# Patient Record
Sex: Male | Born: 1975 | Race: Black or African American | Smoking: Never smoker
Health system: Southern US, Community
[De-identification: ages and names within clinical notes are randomized; demographics above are authoritative.]

---

## 2012-03-14 ENCOUNTER — Other Ambulatory Visit: Payer: Self-pay | Admitting: Occupational Medicine

## 2012-03-14 ENCOUNTER — Ambulatory Visit: Payer: Self-pay

## 2012-03-14 DIAGNOSIS — R52 Pain, unspecified: Secondary | ICD-10-CM

## 2015-08-27 ENCOUNTER — Emergency Department (HOSPITAL_COMMUNITY): Payer: Medicaid Other

## 2015-08-27 ENCOUNTER — Emergency Department (HOSPITAL_COMMUNITY)
Admission: EM | Admit: 2015-08-27 | Discharge: 2015-08-27 | Disposition: A | Discharge status: Home / Self Care | Payer: Medicaid Other | Attending: Emergency Medicine | Admitting: Emergency Medicine

## 2015-08-27 ENCOUNTER — Encounter (HOSPITAL_COMMUNITY): Payer: Self-pay

## 2015-08-27 DIAGNOSIS — R079 Chest pain, unspecified: Secondary | ICD-10-CM

## 2015-08-27 DIAGNOSIS — R000 Tachycardia, unspecified: Secondary | ICD-10-CM | POA: Diagnosis not present

## 2015-08-27 DIAGNOSIS — R911 Solitary pulmonary nodule: Secondary | ICD-10-CM

## 2015-08-27 LAB — I-STAT CHEM 8, ED
BUN: 11 mg/dL (ref 6–20)
CALCIUM ION: 1.1 mmol/L — AB (ref 1.12–1.23)
CHLORIDE: 102 mmol/L (ref 101–111)
Creatinine, Ser: 0.7 mg/dL (ref 0.61–1.24)
Glucose, Bld: 145 mg/dL — ABNORMAL HIGH (ref 65–99)
HEMATOCRIT: 47 % (ref 39.0–52.0)
Hemoglobin: 16 g/dL (ref 13.0–17.0)
POTASSIUM: 3.5 mmol/L (ref 3.5–5.1)
SODIUM: 140 mmol/L (ref 135–145)
TCO2: 23 mmol/L (ref 0–100)

## 2015-08-27 LAB — COMPREHENSIVE METABOLIC PANEL
ALBUMIN: 3.8 g/dL (ref 3.5–5.0)
ALK PHOS: 64 U/L (ref 38–126)
ALT: 18 U/L (ref 17–63)
ANION GAP: 11 (ref 5–15)
AST: 29 U/L (ref 15–41)
BUN: 10 mg/dL (ref 6–20)
CALCIUM: 9.7 mg/dL (ref 8.9–10.3)
CO2: 21 mmol/L — AB (ref 22–32)
Chloride: 105 mmol/L (ref 101–111)
Creatinine, Ser: 0.9 mg/dL (ref 0.61–1.24)
GFR calc Af Amer: >60 mL/min (ref >60)
GFR calc non Af Amer: >60 mL/min (ref >60)
GLUCOSE: 147 mg/dL — AB (ref 65–99)
Potassium: 3.6 mmol/L (ref 3.5–5.1)
SODIUM: 137 mmol/L (ref 135–145)
Total Bilirubin: 0.9 mg/dL (ref 0.3–1.2)
Total Protein: 7.4 g/dL (ref 6.5–8.1)

## 2015-08-27 LAB — CBC
HCT: 42.8 % (ref 39.0–52.0)
HEMOGLOBIN: 15 g/dL (ref 13.0–17.0)
MCH: 28.1 pg (ref 26.0–34.0)
MCHC: 35 g/dL (ref 30.0–36.0)
MCV: 80.1 fL (ref 78.0–100.0)
PLATELETS: 271 10*3/uL (ref 150–400)
RBC: 5.34 MIL/uL (ref 4.22–5.81)
RDW: 12.3 % (ref 11.5–15.5)
WBC: 6 10*3/uL (ref 4.0–10.5)

## 2015-08-27 LAB — LIPASE, BLOOD: Lipase: 20 U/L (ref 11–51)

## 2015-08-27 LAB — I-STAT TROPONIN, ED
Troponin i, poc: 0 ng/mL (ref 0.00–0.08)
Troponin i, poc: 0 ng/mL (ref 0.00–0.08)

## 2015-08-27 MED ORDER — LORAZEPAM 1 MG PO TABS: 1.0000 mg | ORAL_TABLET | Freq: Every day | ORAL | Status: AC

## 2015-08-27 MED ORDER — ONDANSETRON HCL 4 MG/2ML IJ SOLN
4.0000 mg | Freq: Once | INTRAMUSCULAR | Status: AC
  Administered 2015-08-27: 4 mg via INTRAVENOUS
  Filled 2015-08-27: qty 2

## 2015-08-27 MED ORDER — FENTANYL CITRATE (PF) 100 MCG/2ML IJ SOLN
50.0000 ug | Freq: Once | INTRAMUSCULAR | Status: AC
  Administered 2015-08-27: 50 ug via INTRAVENOUS
  Filled 2015-08-27: qty 2

## 2015-08-27 MED ORDER — IOHEXOL 350 MG/ML SOLN
100.0000 mL | Freq: Once | INTRAVENOUS | Status: AC | PRN
  Administered 2015-08-27: 100 mL via INTRAVENOUS

## 2015-08-27 MED ORDER — SODIUM CHLORIDE 0.9 % IV BOLUS (SEPSIS)
1000.0000 mL | Freq: Once | INTRAVENOUS | Status: AC
  Administered 2015-08-27: 1000 mL via INTRAVENOUS

## 2015-08-27 MED ORDER — LORAZEPAM 1 MG PO TABS
0.5000 mg | ORAL_TABLET | Freq: Once | ORAL | Status: AC
Start: 2015-08-27 — End: 2015-08-27
  Administered 2015-08-27: 0.5 mg via ORAL
  Filled 2015-08-27: qty 1

## 2015-08-27 NOTE — ED Notes (Signed)
Per EMS - [facility] for chest pressure, dizziness, inability to sleep, nausea. Pt symptoms intermittent but have progressed since Friday. Pt c/o chest pressure, unable to eat/drink/sleep.   324mg  aspirin, 1 nitro, 500ml bolus

## 2015-08-27 NOTE — Discharge Instructions (Signed)
Nonspecific Chest Pain  °Chest pain can be caused by many different conditions. There is always a chance that your pain could be related to something serious, such as a heart attack or a blood clot in your lungs. Chest pain can also be caused by conditions that are not life-threatening. If you have chest pain, it is very important to follow up with your health care provider. °CAUSES  °Chest pain can be caused by: °· Heartburn. °· Pneumonia or bronchitis. °· Anxiety or stress. °· Inflammation around your heart (pericarditis) or lung (pleuritis or pleurisy). °· A blood clot in your lung. °· A collapsed lung (pneumothorax). It can develop suddenly on its own (spontaneous pneumothorax) or from trauma to the chest. °· Shingles infection (varicella-zoster virus). °· Heart attack. °· Damage to the bones, muscles, and cartilage that make up your chest wall. This can include: °¨ Bruised bones due to injury. °¨ Strained muscles or cartilage due to frequent or repeated coughing or overwork. °¨ Fracture to one or more ribs. °¨ Sore cartilage due to inflammation (costochondritis). °RISK FACTORS  °Risk factors for chest pain may include: °· Activities that increase your risk for trauma or injury to your chest. °· Respiratory infections or conditions that cause frequent coughing. °· Medical conditions or overeating that can cause heartburn. °· Heart disease or family history of heart disease. °· Conditions or health behaviors that increase your risk of developing a blood clot. °· Having had chicken pox (varicella zoster). °SIGNS AND SYMPTOMS °Chest pain can feel like: °· Burning or tingling on the surface of your chest or deep in your chest. °· Crushing, pressure, aching, or squeezing pain. °· Dull or sharp pain that is worse when you move, cough, or take a deep breath. °· Pain that is also felt in your back, neck, shoulder, or arm, or pain that spreads to any of these areas. °Your chest pain may come and go, or it may stay  constant. °DIAGNOSIS °Lab tests or other studies may be needed to find the cause of your pain. Your health care provider may have you take a test called an ambulatory ECG (electrocardiogram). An ECG records your heartbeat patterns at the time the test is performed. You may also have other tests, such as: °· Transthoracic echocardiogram (TTE). During echocardiography, sound waves are used to create a picture of all of the heart structures and to look at how blood flows through your heart. °· Transesophageal echocardiogram (TEE). This is a more advanced imaging test that obtains images from inside your body. It allows your health care provider to see your heart in finer detail. °· Cardiac monitoring. This allows your health care provider to monitor your heart rate and rhythm in real time. °· Holter monitor. This is a portable device that records your heartbeat and can help to diagnose abnormal heartbeats. It allows your health care provider to track your heart activity for several days, if needed. °· Stress tests. These can be done through exercise or by taking medicine that makes your heart beat more quickly. °· Blood tests. °· Imaging tests. °TREATMENT  °Your treatment depends on what is causing your chest pain. Treatment may include: °· Medicines. These may include: °¨ Acid blockers for heartburn. °¨ Anti-inflammatory medicine. °¨ Pain medicine for inflammatory conditions. °¨ Antibiotic medicine, if an infection is present. °¨ Medicines to dissolve blood clots. °¨ Medicines to treat coronary artery disease. °· Supportive care for conditions that do not require medicines. This may include: °¨ Resting. °¨ Applying heat   or cold packs to injured areas. °¨ Limiting activities until pain decreases. °HOME CARE INSTRUCTIONS °· If you were prescribed an antibiotic medicine, finish it all even if you start to feel better. °· Avoid any activities that bring on chest pain. °· Do not use any tobacco products, including  cigarettes, chewing tobacco, or electronic cigarettes. If you need help quitting, ask your health care provider. °· Do not drink alcohol. °· Take medicines only as directed by your health care provider. °· Keep all follow-up visits as directed by your health care provider. This is important. This includes any further testing if your chest pain does not go away. °· If heartburn is the cause for your chest pain, you may be told to keep your head raised (elevated) while sleeping. This reduces the chance that acid will go from your stomach into your esophagus. °· Make lifestyle changes as directed by your health care provider. These may include: °¨ Getting regular exercise. Ask your health care provider to suggest some activities that are safe for you. °¨ Eating a heart-healthy diet. A registered dietitian can help you to learn healthy eating options. °¨ Maintaining a healthy weight. °¨ Managing diabetes, if necessary. °¨ Reducing stress. °SEEK MEDICAL CARE IF: °· Your chest pain does not go away after treatment. °· You have a rash with blisters on your chest. °· You have a fever. °SEEK IMMEDIATE MEDICAL CARE IF:  °· Your chest pain is worse. °· You have an increasing cough, or you cough up blood. °· You have severe abdominal pain. °· You have severe weakness. °· You faint. °· You have chills. °· You have sudden, unexplained chest discomfort. °· You have sudden, unexplained discomfort in your arms, back, neck, or jaw. °· You have shortness of breath at any time. °· You suddenly start to sweat, or your skin gets clammy. °· You feel nauseous or you vomit. °· You suddenly feel light-headed or dizzy. °· Your heart begins to beat quickly, or it feels like it is skipping beats. °These symptoms may represent a serious problem that is an emergency. Do not wait to see if the symptoms will go away. Get medical help right away. Call your local emergency services (911 in the [place] Do not drive yourself to the hospital. °  °This  information is not intended to replace advice given to you by your health care provider. Make sure you discuss any questions you have with your health care provider. °  °Document Released: 07/29/2005 Document Revised: 11/09/2014 Document Reviewed: 05/25/2014 °Elsevier Interactive Patient Education ©2016 [employer] °Pulmonary Nodule °A pulmonary nodule is a small, round growth of tissue in the lung. Pulmonary nodules can range in size from less than 1/5 inch (4 mm) to a little bigger than an inch (25 mm). Most pulmonary nodules are detected when imaging tests of the lung are being performed for a different problem. Pulmonary nodules are usually not cancerous (benign). However, some pulmonary nodules are cancerous (malignant). Follow-up treatment or testing is based on the size of the pulmonary nodule and your risk of getting lung cancer.  °CAUSES °Benign pulmonary nodules can be caused by various things. Some of the causes include:  °· Bacterial, fungal, or viral infections. This is usually an old infection that is no longer active, but it can sometimes be a current, active infection. °· A benign mass of tissue. °· Inflammation from conditions such as rheumatoid arthritis.   °· Abnormal blood vessels in the lungs. °Malignant pulmonary nodules can result from   lung cancer or from cancers that spread to the lung from other places in the body. °SIGNS AND SYMPTOMS °Pulmonary nodules usually do not cause symptoms. °DIAGNOSIS °Most often, pulmonary nodules are found incidentally when an X-ray or CT scan is performed to look for some other problem in the lung area. To help determine whether a pulmonary nodule is benign or malignant, your health care provider will take a medical history and order a variety of tests. Tests done may include:  °· Blood tests. °· A skin test called a tuberculin test. This test is used to determine if you have been exposed to the germ that causes tuberculosis.   °· Chest X-rays. If possible, a  new X-ray may be compared with X-rays you have had in the past.    °· CT scan. This test shows smaller pulmonary nodules more clearly than an X-ray.   °· Positron emission tomography (PET) scan. In this test, a safe amount of a radioactive substance is injected into the bloodstream. Then, the scan takes a picture of the pulmonary nodule. The radioactive substance is eliminated from your body in your urine.   °· Biopsy. A tiny piece of the pulmonary nodule is removed so it can be checked under a microscope. °TREATMENT  °Pulmonary nodules that are benign normally do not require any treatment because they usually do not cause symptoms or breathing problems. Your health care provider may want to monitor the pulmonary nodule through follow-up CT scans. The frequency of these CT scans will vary based on the size of the nodule and the risk factors for lung cancer. For example, CT scans will need to be done more frequently if the pulmonary nodule is larger and if you have a history of smoking and a family history of cancer. Further testing or biopsies may be done if any follow-up CT scan shows that the size of the pulmonary nodule has increased. °HOME CARE INSTRUCTIONS °· Only take over-the-counter or prescription medicines as directed by your health care provider. °· Keep all follow-up appointments with your health care provider. °SEEK MEDICAL CARE IF: °· You have trouble breathing when you are active.   °· You feel sick or unusually tired.   °· You do not feel like eating.   °· You lose weight without trying to.   °· You develop chills or night sweats.   °SEEK IMMEDIATE MEDICAL CARE IF: °· You cannot catch your breath, or you begin wheezing.   °· You cannot stop coughing.   °· You cough up blood.   °· You become dizzy or feel like you are going to pass out.   °· You have sudden chest pain.   °· You have a fever or persistent symptoms for more than 2-3 days.   °· You have a fever and your symptoms suddenly get worse. °MAKE  SURE YOU: °· Understand these instructions. °· Will watch your condition. °· Will get help right away if you are not doing well or get worse. °  °This information is not intended to replace advice given to you by your health care provider. Make sure you discuss any questions you have with your health care provider. °  °Document Released: 08/16/2009 Document Revised: 06/21/2013 Document Reviewed: 04/10/2013 °Elsevier Interactive Patient Education ©2016 Elsevier Inc. ° °

## 2015-08-27 NOTE — ED Provider Notes (Signed)
CSN: [id number]     Arrival date & time 08/27/15  0831 History   First MD Initiated Contact with Patient 08/27/15 [phone]     Chief Complaint  Patient presents with  . Chest Pain     Patient is a 39 y.o. male presenting with chest pain. The history is provided by the patient and the spouse. No language interpreter was used.  Chest Pain  Mr. Ahemd presents for evaluation of chest pain. History is provided by the patient and wife. They declined interpreter. They report 3 days of waxing and waning central chest pain. He has associated shortness of breath, nausea, vomiting, abdominal pain. He's had poor sleep. Symptoms are waxing and waning but persistently worsening since they began 3 days ago. He was seen at urgent care on Friday. No reports of any medical problems. No medications. No family medical history. No recent travel. His biggest concern is his lack of sleeping.  No past medical history on file. No past surgical history on file. No family history on file. Social History  Substance Use Topics  . Smoking status: Never Smoker   . Smokeless tobacco: Not on file  . Alcohol Use: No    Review of Systems  Cardiovascular: Positive for chest pain.  All other systems reviewed and are negative.     Allergies  Review of patient's allergies indicates no known allergies.  Home Medications   Prior to Admission medications   Not on File   There were no vitals taken for this visit. Physical Exam  Constitutional: He is oriented to person, place, and time. He appears well-developed and well-nourished. He appears distressed.  HENT:  Head: Normocephalic and atraumatic.  Cardiovascular: Regular rhythm.   No murmur heard. Tachycardic  Pulmonary/Chest: Breath sounds normal. No respiratory distress.  tachypneic  Abdominal: Soft. There is no tenderness. There is no rebound and no guarding.  Musculoskeletal: He exhibits no edema or tenderness.  Neurological: He is alert and oriented to  person, place, and time.  Skin: Skin is warm and dry.  Psychiatric: He has a normal mood and affect. His behavior is normal.  Nursing note and vitals reviewed.   ED Course  Procedures (including critical care time) Labs Review Labs Reviewed  COMPREHENSIVE METABOLIC PANEL - Abnormal; Notable for the following:    CO2 21 (*)    Glucose, Bld 147 (*)    All other components within normal limits  I-STAT CHEM 8, ED - Abnormal; Notable for the following:    Glucose, Bld 145 (*)    Calcium, Ion 1.10 (*)    All other components within normal limits  CBC  LIPASE, BLOOD  I-STAT TROPOININ, ED  I-STAT TROPOININ, ED    Imaging Review Ct Angio Chest Pe W/cm &/or Wo Cm  08/27/2015  CLINICAL DATA:  Patient with chest pain and shortness of breath for 4 days. No significant past medical history. EXAM: CT ANGIOGRAPHY CHEST WITH CONTRAST TECHNIQUE: Multidetector CT imaging of the chest was performed using the standard protocol during bolus administration of intravenous contrast. Multiplanar CT image reconstructions and MIPs were obtained to evaluate the vascular anatomy. CONTRAST:  OMNIPAQUE IOHEXOL 350 MG/ML SOLN COMPARISON:  Chest radiograph 08/27/2015 FINDINGS: Mediastinum/Nodes: Heart is normal in size. No pericardial effusion. Aorta and main pulmonary artery are normal in caliber. No axillary, mediastinal or hilar lymphadenopathy. Residual thymus within the anterior mediastinum. Visualized thyroid is unremarkable. Bilateral gynecomastia. Adequate opacification of the pulmonary arterial system. No evidence for pulmonary embolism. Lungs/Pleura: Central airways  are patent. There is a 4 mm right middle lobe pulmonary nodule (image 39; series 6). No pleural effusion or pneumothorax. No large area of pulmonary consolidation. Upper abdomen: Visualized aspect of the liver and gallbladder are unremarkable. Spleen is unremarkable. Normal adrenal glands. Musculoskeletal: No aggressive or acute appearing  osseous lesions. Review of the MIP images confirms the above findings. IMPRESSION: No acute pulmonary embolism. No acute process within the chest. 4 mm right middle lobe pulmonary nodule. If the patient is at high risk for bronchogenic carcinoma, follow-up chest CT at 1 year is recommended. If the patient is at low risk, no follow-up is needed. This recommendation follows the consensus statement: Guidelines for Management of Small Pulmonary Nodules Detected on CT Scans: A Statement from the Fleischner Society as published in Radiology 2005; 237:395-400. Electronically Signed   By: Annia Beltrew  Davis M.D.   On: 08/27/2015 12:10   Dg Chest Port 1 View  08/27/2015  CLINICAL DATA:  Chest pain for 1 day EXAM: PORTABLE CHEST 1 VIEW COMPARISON:  None. FINDINGS: Lungs are clear. Heart size and pulmonary vascularity are normal. No adenopathy. No pneumothorax. No bone lesions. IMPRESSION: No edema or consolidation. Electronically Signed   By: Bretta BangWilliam  Woodruff III M.D.   On: 08/27/2015 09:02   I have personally reviewed and evaluated these images and lab results as part of my medical decision-making.   EKG Interpretation   Date/Time:  Tuesday August 27 2015 08:31:51 EDT Ventricular Rate:  99 PR Interval:  161 QRS Duration: 98 QT Interval:  340 QTC Calculation: 436 R Axis:   10 Text Interpretation:  Sinus rhythm RSR' in V1 or V2, right VCD or RVH ST  elev, probable normal early repol pattern Confirmed by Lincoln Brighamees, Liz [phone]([id number])  on 08/27/2015 8:46:32 AM      MDM   Final diagnoses:  Chest pain, unspecified chest pain type  Pulmonary nodule    Patient here for evaluation of chest discomfort, shortness of breath, poor sleep. Patient does have intermittent chest discomfort in the department and appears anxious during these episodes with increased respiratory rate. Lung exam is clear. No evidence of reactive airway disease, pneumonia, PE, ACS, CHF. CT scan does demonstrate a pulmonary nodule, discussed outpatient  follow-up for repeat imaging in 1 year. Concern for possible anxiety components, providing small prescription for Ativan with close return precautions.  Tilden FossaElizabeth Quentin Shorey, MD 08/27/15 [phone]

## 2015-08-27 NOTE — ED Notes (Signed)
Dr. Rees at bedside  

## 2015-09-01 ENCOUNTER — Encounter (HOSPITAL_COMMUNITY): Payer: Self-pay

## 2015-09-01 ENCOUNTER — Emergency Department (HOSPITAL_COMMUNITY)
Admission: EM | Admit: 2015-09-01 | Discharge: 2015-09-01 | Disposition: A | Discharge status: Home / Self Care | Payer: Medicaid Other | Attending: Emergency Medicine | Admitting: Emergency Medicine

## 2015-09-01 DIAGNOSIS — Z79899 Other long term (current) drug therapy: Secondary | ICD-10-CM | POA: Insufficient documentation

## 2015-09-01 DIAGNOSIS — R1011 Right upper quadrant pain: Secondary | ICD-10-CM | POA: Diagnosis not present

## 2015-09-01 DIAGNOSIS — R350 Frequency of micturition: Secondary | ICD-10-CM | POA: Insufficient documentation

## 2015-09-01 DIAGNOSIS — R1010 Upper abdominal pain, unspecified: Secondary | ICD-10-CM

## 2015-09-01 DIAGNOSIS — R109 Unspecified abdominal pain: Secondary | ICD-10-CM | POA: Diagnosis present

## 2015-09-01 LAB — COMPREHENSIVE METABOLIC PANEL
ALT: 20 U/L (ref 17–63)
AST: 17 U/L (ref 15–41)
Albumin: 4.4 g/dL (ref 3.5–5.0)
Alkaline Phosphatase: 73 U/L (ref 38–126)
Anion gap: 8 (ref 5–15)
BUN: 8 mg/dL (ref 6–20)
CHLORIDE: 104 mmol/L (ref 101–111)
CO2: 26 mmol/L (ref 22–32)
Calcium: 9.6 mg/dL (ref 8.9–10.3)
Creatinine, Ser: 0.78 mg/dL (ref 0.61–1.24)
GFR calc Af Amer: >60 mL/min (ref >60)
Glucose, Bld: 105 mg/dL — ABNORMAL HIGH (ref 65–99)
POTASSIUM: 3.3 mmol/L — AB (ref 3.5–5.1)
Sodium: 138 mmol/L (ref 135–145)
TOTAL PROTEIN: 8.6 g/dL — AB (ref 6.5–8.1)
Total Bilirubin: 0.5 mg/dL (ref 0.3–1.2)

## 2015-09-01 LAB — URINALYSIS, ROUTINE W REFLEX MICROSCOPIC
Bilirubin Urine: NEGATIVE
Glucose, UA: NEGATIVE mg/dL
Ketones, ur: NEGATIVE mg/dL
LEUKOCYTES UA: NEGATIVE
NITRITE: NEGATIVE
PROTEIN: NEGATIVE mg/dL
SPECIFIC GRAVITY, URINE: 1.023 (ref 1.005–1.030)
UROBILINOGEN UA: 1 mg/dL (ref 0.0–1.0)
pH: 7 (ref 5.0–8.0)

## 2015-09-01 LAB — URINE MICROSCOPIC-ADD ON

## 2015-09-01 LAB — CBC
HEMATOCRIT: 43.8 % (ref 39.0–52.0)
HEMOGLOBIN: 16 g/dL (ref 13.0–17.0)
MCH: 29.4 pg (ref 26.0–34.0)
MCHC: 36.5 g/dL — ABNORMAL HIGH (ref 30.0–36.0)
MCV: 80.5 fL (ref 78.0–100.0)
Platelets: 297 10*3/uL (ref 150–400)
RBC: 5.44 MIL/uL (ref 4.22–5.81)
RDW: 12.2 % (ref 11.5–15.5)
WBC: 7.1 10*3/uL (ref 4.0–10.5)

## 2015-09-01 LAB — LIPASE, BLOOD: LIPASE: 22 U/L (ref 11–51)

## 2015-09-01 MED ORDER — GI COCKTAIL ~~LOC~~
30.0000 mL | Freq: Once | ORAL | Status: AC
  Administered 2015-09-01: 30 mL via ORAL
  Filled 2015-09-01: qty 30

## 2015-09-01 MED ORDER — OMEPRAZOLE 20 MG PO CPDR: 20.0000 mg | DELAYED_RELEASE_CAPSULE | Freq: Every day | ORAL | Status: AC

## 2015-09-01 NOTE — ED Notes (Signed)
Patient states he red in the toilet 3 days ago and c/o mid abdominal pain. Patient states he had a stool today, but no blood present. Patient denies N/V. Patient also reports that he feels full of urine and when he goes he only goes a little bit.

## 2015-09-01 NOTE — ED Provider Notes (Signed)
CSN: [id number]     Arrival date & time 09/01/15  1508 History   First MD Initiated Contact with Patient 09/01/15 1658     Chief Complaint  Patient presents with  . Abdominal Pain  . Rectal Bleeding   HPI   Mr. George Ray is an 39 y.o. male with no significant PMH who presents to the ED for evaluation of abdominal pain. He is a native arabic speaker but declines translator at this time. He states he was in his usual state of health until 3 days ago when he began experiencing sharp abdominal pain all across his upper abdomen. He states that the pain comes in waves and is worse at night. When it flares, he states he feels like his abdominal muscles start shaking all over. The flares can last a few minutes to an hour. Denies N/V/D. He does endorse increased flatulence the past few days. He states that three days ago he had a BM with some bright red blood in the toilet bowl but no further bleeding since then. Last BM was reportedly this AM and was normal. He also states that he feels like he has been having to urinate more frequently and when he does he cannot empty his bladder completely. Denies hematuria or dysuria. Denies new genital lesions or penile discharge. [facility] for chest pain with associated SOB and abdominal pain. Workup including CTA was negative at that time. He denies chest pain, SOB, diaphoresis, weakness with his current complaint of abdominal pain. Denies EtOH, tobacco, or other drug use.   History reviewed. No pertinent past medical history. History reviewed. No pertinent past surgical history. History reviewed. No pertinent family history. Social History  Substance Use Topics  . Smoking status: Never Smoker   . Smokeless tobacco: Former NeurosurgeonUser  . Alcohol Use: No    Review of Systems  All other systems reviewed and are negative.     Allergies  Review of patient's allergies indicates no known allergies.  Home Medications   Prior to Admission  medications   Medication Sig Start Date End Date Taking? Authorizing Provider  Doxylamine Succinate, Sleep, (SLEEP AID PO) Take 1 tablet by mouth at bedtime as needed (for sleep).   Yes Historical Provider, MD  LORazepam (ATIVAN) 1 MG tablet Take 1 tablet (1 mg total) by mouth at bedtime. 08/27/15  Yes Tilden FossaElizabeth Rees, MD  Probiotic Product [facility]) CAPS Take 1 capsule by mouth once.   Yes Historical Provider, MD   BP 119/81 mmHg  Pulse 89  Temp(Src) 98 F (36.7 C) (Oral)  Resp 20  SpO2 98% Physical Exam  Constitutional: He is oriented to person, place, and time. No distress.  HENT:  Right Ear: External ear normal.  Left Ear: External ear normal.  Nose: Nose normal.  Mouth/Throat: Oropharynx is clear and moist. No oropharyngeal exudate.  Eyes: Conjunctivae and EOM are normal. Pupils are equal, round, and reactive to light.  Neck: Normal range of motion. Neck supple.  Cardiovascular: Normal rate, regular rhythm, normal heart sounds and intact distal pulses.   No murmur heard. Pulmonary/Chest: Effort normal and breath sounds normal. No respiratory distress. He has no wheezes. He exhibits no tenderness.  Abdominal:  Hypoactive bowel sounds. Diffuse tenderness to RUQ and LUQ. No guarding or rigidity. Pt's suprapubic area/bladder feels full/firm. Nontender. No CVA tenderness.   Genitourinary:  No stool in vault. No stool or gross blood on glove. No masses palpable. Pt endorses  tenderness on exam.   Musculoskeletal: Normal range of motion. He exhibits no edema.  Lymphadenopathy:    He has no cervical adenopathy.  Neurological: He is alert and oriented to person, place, and time. No cranial nerve deficit.  Skin: Skin is warm and dry. He is not diaphoretic.  Nursing note and vitals reviewed.   ED Course  Procedures (including critical care time) Labs Review Labs Reviewed  COMPREHENSIVE METABOLIC PANEL - Abnormal; Notable for the following:    Potassium 3.3 (*)     Glucose, Bld 105 (*)    Total Protein 8.6 (*)    All other components within normal limits  CBC - Abnormal; Notable for the following:    MCHC 36.5 (*)    All other components within normal limits  URINALYSIS, ROUTINE W REFLEX MICROSCOPIC (NOT AT [facility]) - Abnormal; Notable for the following:    APPearance HAZY (*)    Hgb urine dipstick TRACE (*)    All other components within normal limits  LIPASE, BLOOD  URINE MICROSCOPIC-ADD ON  OCCULT BLOOD X 1 CARD TO LAB, STOOL  GC/CHLAMYDIA PROBE AMP ([place]) NOT AT [facility]    Imaging Review No results found. I have personally reviewed and evaluated these images and lab results as part of my medical decision-making.   EKG Interpretation None      MDM   Final diagnoses:  Pain of upper abdomen  Urinary frequency    5:15 PM Unclear etiology of pt's abdominal pain at this time. I am concerned about possible GI bleed given his report of BRBPR but he is not actively bleeding currently and denies subsequent episodes of bleeding after the initial episode. Will check CBC, CMP, Lipase, UA, FOBT. Pending lab findings, will consider if further abdominal imaging is indicated. Will trial GI cocktail.    6:45 PM Labs unremarkable. Abdominal exam is nonfocal with no signs of peritoneal signs. Pt reports resolution of abdominal pain with GI cocktail. Urine does not show infection. Trace blood. Instructed pt to f/u with GI for abdominal complaints/bleeding and PCP/urology for urinary symptoms. Will send urine for gc/chlamydia. Starting pt on omeprazole for possible GERD. Return precautions given.   Carlene Coria, PA-C 09/01/15 [id number]  Margarita Grizzle, MD 09/01/15 2130

## 2015-09-01 NOTE — Discharge Instructions (Signed)
[facility] regarding your abdominal pain and concerns about your colon. Your workup here today was normal. I am prescribing Priolosec for you to take once a day until you can see the GI doctor.  Regarding your urinary symptoms, your urine today does not show signs of infection. Please follow-up with your primary care provider if your symptoms continue.   Please obtain all of your results from medical records or have your doctors office obtain the results - share them with your doctor - you should be seen at your doctors office in the next 2 days. Call today to arrange your follow up. Take the medications as prescribed. Please review all of the medicines and only take them if you do not have an allergy to them. Please be aware that if you are taking birth control pills, taking other prescriptions, ESPECIALLY ANTIBIOTICS may make the birth control ineffective - if this is the case, either do not engage in sexual activity or use alternative methods of birth control such as condoms until you have finished the medicine and your family doctor says it is OK to restart them. If you are on a blood thinner such as COUMADIN, be aware that any other medicine that you take may cause the coumadin to either work too much, or not enough - you should have your coumadin level rechecked in next 7 days if this is the case.  ?  It is also a possibility that you have an allergic reaction to any of the medicines that you have been prescribed - Everybody reacts differently to medications and while MOST people have no trouble with most medicines, you may have a reaction such as nausea, vomiting, rash, swelling, shortness of breath. If this is the case, please stop taking the medicine immediately and contact your physician.  ?  You should return to the ER if you develop severe or worsening symptoms.

## 2016-11-14 IMAGING — CT CT ANGIO CHEST
2 of 9 series · 18 of 46 positions shown · IV contrast (OMNI)
Comparison: Chest radiograph 08/27/2015

CLINICAL DATA: Patient with chest pain and shortness of breath for
4 days. No significant past medical history.

EXAM:
CT ANGIOGRAPHY CHEST WITH CONTRAST
TECHNIQUE: Multidetector CT imaging of the chest was performed using the
standard protocol during bolus administration of intravenous
contrast. Multiplanar CT image reconstructions and MIPs were
obtained to evaluate the vascular anatomy.
CONTRAST:  100mL OMNIPAQUE IOHEXOL 350 MG/ML SOLN

[Series 5: thins · axial · 0.67mm/px · z∈[-242,-6]mm · 15 of 268 slices shown]
[im 16/268  lung]
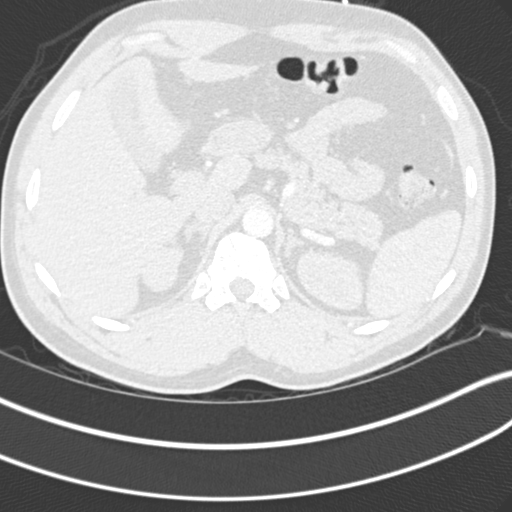
[im 32/268  soft-tissue]
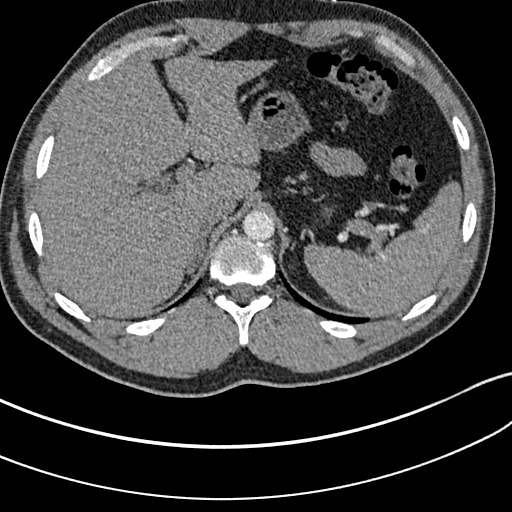
[im 48/268  lung]
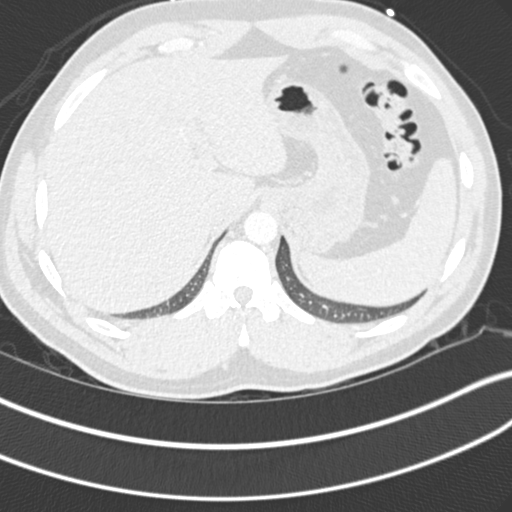
[im 63/268  soft-tissue]
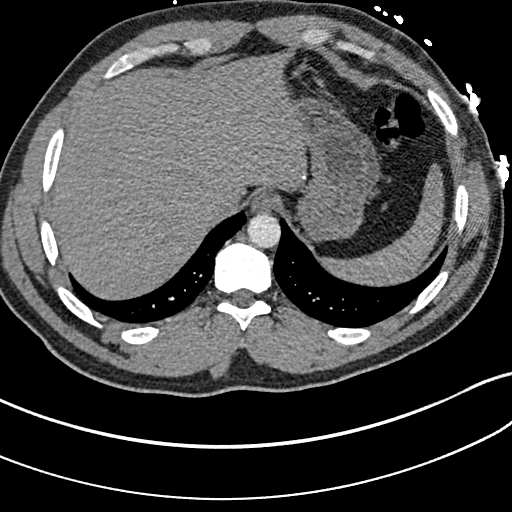
[im 79/268  lung]
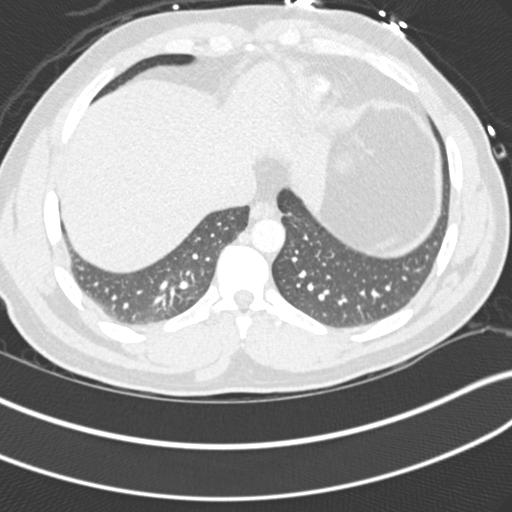
[im 95/268  soft-tissue]
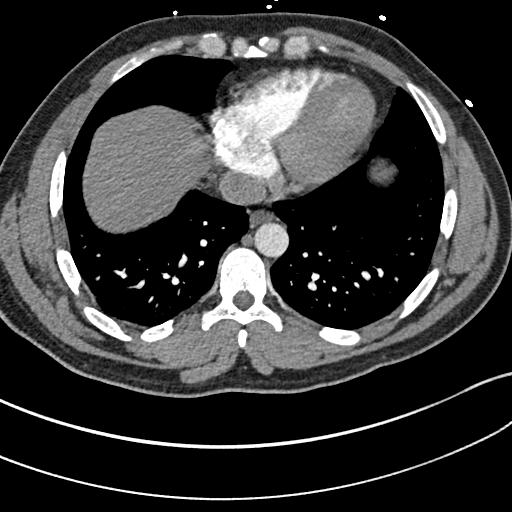
[im 110/268  lung]
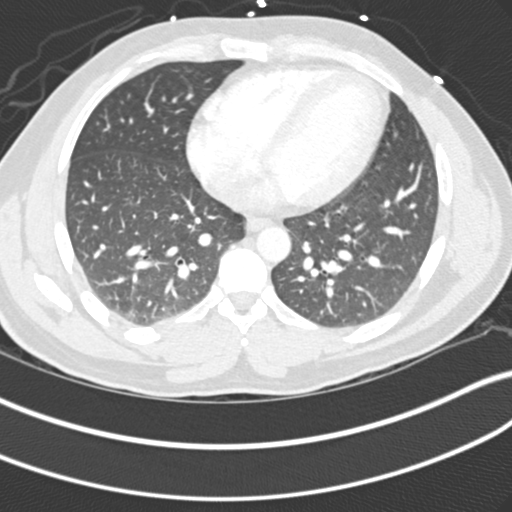
[im 142/268  soft-tissue]
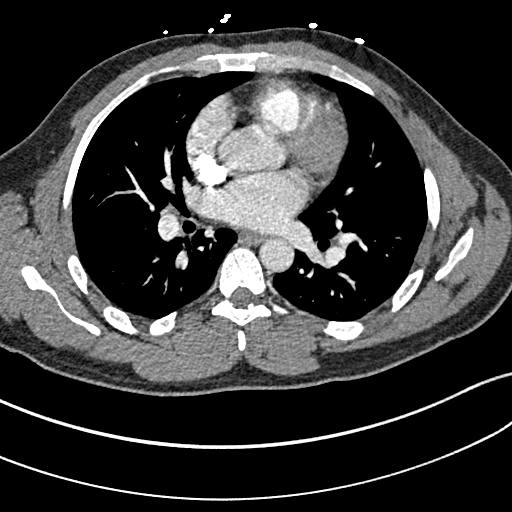
[im 158/268  lung]
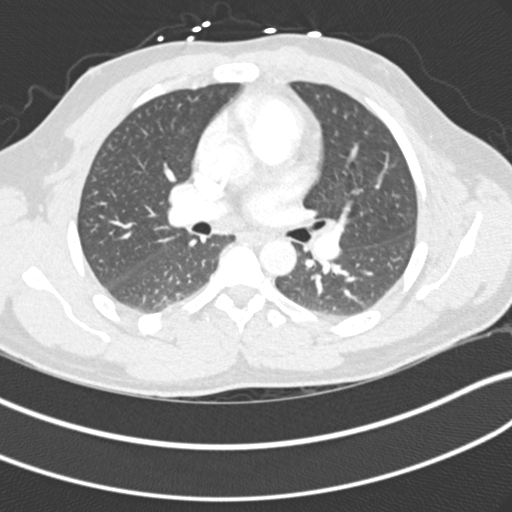
[im 173/268  soft-tissue]
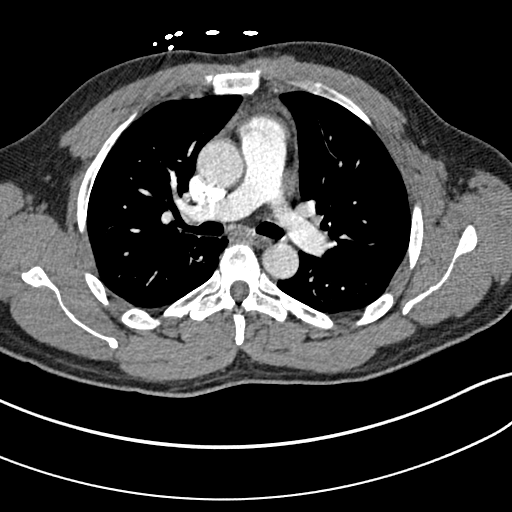
[im 189/268  lung]
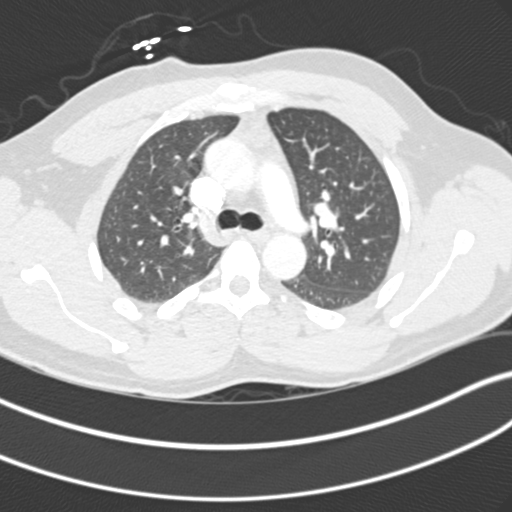
[im 205/268  soft-tissue]
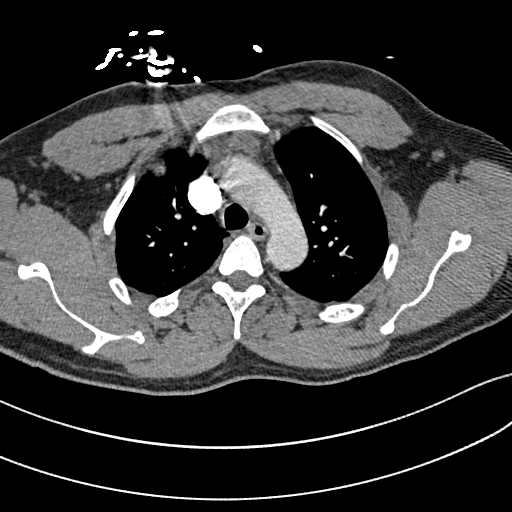
[im 220/268  lung]
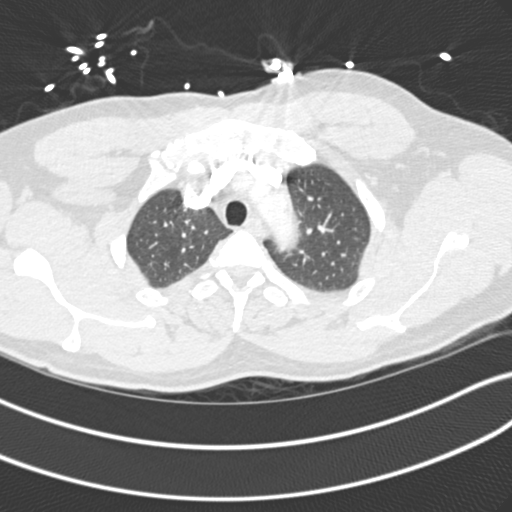
[im 236/268  soft-tissue]
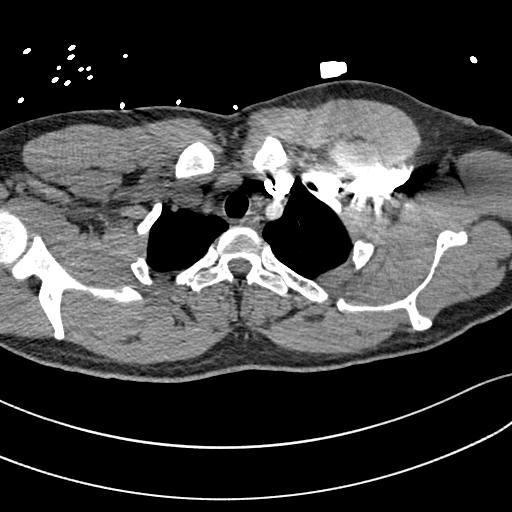
[im 252/268  lung]
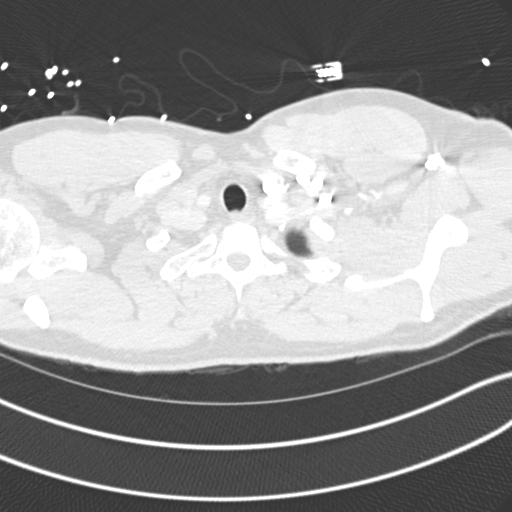

[Series 7: coronal mpr · coronal · 0.54mm/px · 3 of 126 slices shown]
[im 32/126  soft-tissue]
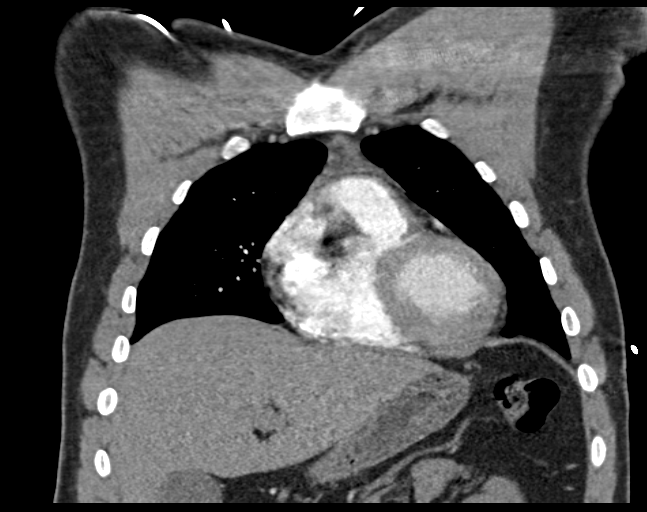
[im 63/126  soft-tissue]
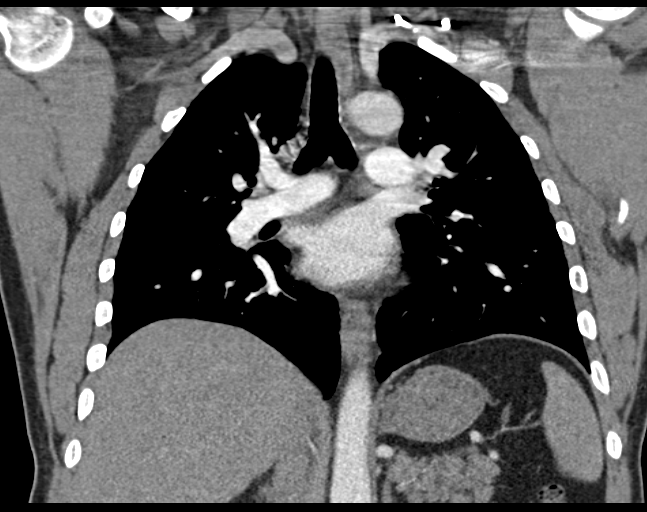
[im 94/126  soft-tissue]
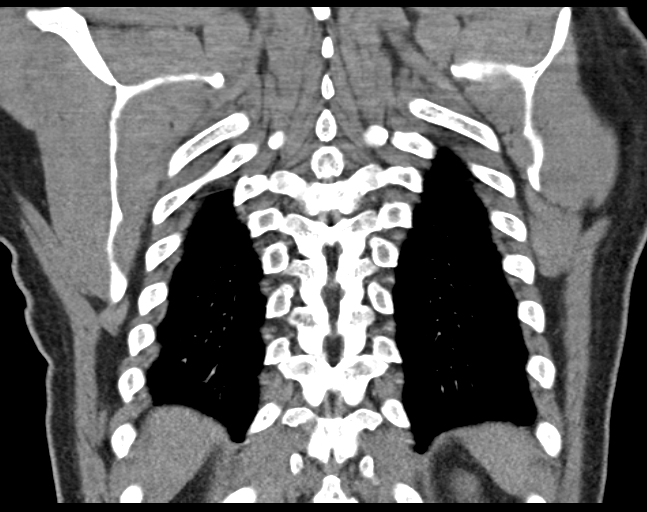

[18 of 46 positions shown; findings below may reference images not displayed]

FINDINGS: Mediastinum/Nodes: Heart is normal in size. No pericardial effusion.
Aorta and main pulmonary artery are normal in caliber. No axillary,
mediastinal or hilar lymphadenopathy. Residual thymus within the
anterior mediastinum. Visualized thyroid is unremarkable. Bilateral
gynecomastia.

Adequate opacification of the pulmonary arterial system. No evidence
for pulmonary embolism.

Lungs/Pleura: Central airways are patent. There is a 4 mm right
middle lobe pulmonary nodule (image 39; series 6). No pleural
effusion or pneumothorax. No large area of pulmonary consolidation.

Upper abdomen: Visualized aspect of the liver and gallbladder are
unremarkable. Spleen is unremarkable. Normal adrenal glands.

Musculoskeletal: No aggressive or acute appearing osseous lesions.

Review of the MIP images confirms the above findings.
IMPRESSION: No acute pulmonary embolism.

No acute process within the chest.

4 mm right middle lobe pulmonary nodule. If the patient is at high
risk for bronchogenic carcinoma, follow-up chest CT at 1 year is
recommended. If the patient is at low risk, no follow-up is needed.
This recommendation follows the consensus statement: Guidelines for
Management of Small Pulmonary Nodules Detected on CT Scans: A
Statement from the [HOSPITAL] as published in Radiology
